# Patient Record
Sex: Female | Born: 1990 | Race: Black or African American | Hispanic: No | Marital: Single | Smoking: Never smoker
Health system: Southern US, Community
[De-identification: ages and names within clinical notes are randomized; demographics above are authoritative.]

---

## 2012-05-22 ENCOUNTER — Emergency Department (HOSPITAL_COMMUNITY)
Admission: EM | Admit: 2012-05-22 | Discharge: 2012-05-22 | Disposition: A | Discharge status: Home / Self Care | Payer: No Typology Code available for payment source | Attending: Emergency Medicine | Admitting: Emergency Medicine

## 2012-05-22 ENCOUNTER — Emergency Department (HOSPITAL_COMMUNITY): Payer: No Typology Code available for payment source

## 2012-05-22 ENCOUNTER — Encounter (HOSPITAL_COMMUNITY): Payer: Self-pay | Admitting: Emergency Medicine

## 2012-05-22 DIAGNOSIS — Y92410 Unspecified street and highway as the place of occurrence of the external cause: Secondary | ICD-10-CM | POA: Insufficient documentation

## 2012-05-22 DIAGNOSIS — Y9389 Activity, other specified: Secondary | ICD-10-CM | POA: Insufficient documentation

## 2012-05-22 DIAGNOSIS — T148XXA Other injury of unspecified body region, initial encounter: Secondary | ICD-10-CM

## 2012-05-22 DIAGNOSIS — IMO0002 Reserved for concepts with insufficient information to code with codable children: Secondary | ICD-10-CM | POA: Insufficient documentation

## 2012-05-22 DIAGNOSIS — S93409A Sprain of unspecified ligament of unspecified ankle, initial encounter: Secondary | ICD-10-CM

## 2012-05-22 DIAGNOSIS — S0993XA Unspecified injury of face, initial encounter: Secondary | ICD-10-CM | POA: Insufficient documentation

## 2012-05-22 DIAGNOSIS — S139XXA Sprain of joints and ligaments of unspecified parts of neck, initial encounter: Secondary | ICD-10-CM | POA: Insufficient documentation

## 2012-05-22 MED ORDER — ACETAMINOPHEN 325 MG PO TABS
650.0000 mg | ORAL_TABLET | Freq: Once | ORAL | Status: AC
  Administered 2012-05-22: 650 mg via ORAL
  Filled 2012-05-22: qty 2

## 2012-05-22 MED ORDER — CYCLOBENZAPRINE HCL 10 MG PO TABS: 10.0000 mg | ORAL_TABLET | Freq: Two times a day (BID) | ORAL | Status: DC | PRN
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
## 2012-05-22 NOTE — ED Notes (Signed)
Ortho paged. 

## 2012-05-22 NOTE — ED Notes (Signed)
Pt ambulatory leaving ED with mother. Pt given d/c teaching and education on prescription. Pt verbalized understanding of d/c teaching and has no further questions upon d/c. Pt does not appear to be in acute distress upon d/c.

## 2012-05-22 NOTE — Progress Notes (Signed)
Orthopedic Tech Progress Note Patient Details:  Allison Cooley [birth year] [id number]  Ortho Devices Type of Ortho Device: Ankle Air splint Ortho Device/Splint Location: left ankle Ortho Device/Splint Interventions: Application   Bonnell Placzek 05/22/2012, 2:37 PM
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
## 2012-05-22 NOTE — ED Notes (Signed)
Pt mother at bedside. Pt mentating appropriately. Pr reports decrease in pain. Pt denies nausea. Pt denies numbness and tingling. Pt able to move all extremities.
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
## 2015-06-12 ENCOUNTER — Ambulatory Visit (INDEPENDENT_AMBULATORY_CARE_PROVIDER_SITE_OTHER): Payer: 59 | Admitting: Podiatry

## 2015-06-12 ENCOUNTER — Encounter: Payer: Self-pay | Admitting: Podiatry

## 2015-06-12 ENCOUNTER — Ambulatory Visit (INDEPENDENT_AMBULATORY_CARE_PROVIDER_SITE_OTHER): Payer: 59

## 2015-06-12 VITALS — BP 126/92 | HR 100 | Resp 12

## 2015-06-12 DIAGNOSIS — M2010 Hallux valgus (acquired), unspecified foot: Secondary | ICD-10-CM

## 2015-06-12 DIAGNOSIS — M62838 Other muscle spasm: Secondary | ICD-10-CM | POA: Diagnosis not present

## 2015-06-12 DIAGNOSIS — M79673 Pain in unspecified foot: Secondary | ICD-10-CM | POA: Diagnosis not present

## 2015-06-12 DIAGNOSIS — M722 Plantar fascial fibromatosis: Secondary | ICD-10-CM

## 2015-06-12 DIAGNOSIS — Q6650 Congenital pes planus, unspecified foot: Secondary | ICD-10-CM | POA: Diagnosis not present

## 2015-06-12 MED ORDER — CYCLOBENZAPRINE HCL 10 MG PO TABS: 10.0000 mg | ORAL_TABLET | Freq: Three times a day (TID) | ORAL | Status: AC | PRN

## 2015-06-12 NOTE — Progress Notes (Signed)
   Subjective:    Patient ID: Allison Cooley, female    DOB: 1991-04-05, 24 y.o.   MRN: [id number]  HPI: She presents today with chief complaint of painful leg and foot spasms usually in the afternoon or evening. She states this is been going on for about 2 weeks. She remarks that she had bunions when she was a child and that her feet are flat and she's never done anything about either one of these. 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    Review of Systems  All other systems reviewed and are negative.      Objective:   Physical Exam: She is a 25 year old African-American female in good spirits vital signs stable alert and oriented 3 in no apparent distress. Pulses are strongly palpable neurologic sensorium is intact versus Weinstein monofilament. Deep tendon reflexes are intact bilateral and muscle strength +5 over 5 dorsiflexion plantar flexors and inverters and everters all intrinsic musculature is intact. Orthopedic evaluation to demonstrate all joints distal to the ankle for range of motion without crepitation that she does have severe flexible flatfoot deformity and mild gastroc equinus bilaterally. Moderate to severe hallux abductovalgus deformity with an elongated second metatarsal prominent reactive hyperkeratosis subsecond and fifth of the left foot. No open lesions or wounds. Radiographs confirm severe pes planus with severe hallux abductovalgus deformity with a first intermetatarsal angle greater than normal values of the proximal 15-20. Elongated second metatarsal also noted severe pes planus no coalitions are apparent. No fractures are apparent.      Assessment & Plan:  Assessment: Severe pes planus with muscle spasms bilateral foot severe hallux valgus deformity.  Plan: Recommended orthotics and prescribed Flexeril 10 mg to be taken at bedtime. I will follow-up with her after her orthotics are in. We did discuss the need for reconstructive surgery  flatfoot or bunion or both and she will discuss this with her family and get back to [place].

## 2015-06-26 ENCOUNTER — Telehealth: Payer: Self-pay | Admitting: *Deleted

## 2015-06-26 NOTE — Telephone Encounter (Signed)
Pt states she has an question about the orthotics she is to pick up.

## 2015-06-26 NOTE — Telephone Encounter (Signed)
Patient had question as to if there was a warranty on the orthotics.  I told the patient no there is no warranty but the manufacturer do state that they will make what ever adjustments necessary to be certain that they are exact to each patient.

## 2015-07-03 ENCOUNTER — Ambulatory Visit: Payer: 59 | Admitting: *Deleted

## 2015-07-03 ENCOUNTER — Ambulatory Visit: Payer: 59 | Admitting: Podiatry

## 2015-07-03 DIAGNOSIS — M722 Plantar fascial fibromatosis: Secondary | ICD-10-CM

## 2015-07-03 NOTE — Progress Notes (Signed)
Patient ID: Allison Cooley, female   DOB: 1991-03-21, 25 y.o.   MRN: [id number] Patient presents for orthotic pick up.  Verbal and written break in and wear instructions given.  Patient will follow up in 4 weeks if symptoms worsen or fail to improve.

## 2015-07-03 NOTE — Patient Instructions (Signed)

## 2015-10-02 ENCOUNTER — Ambulatory Visit (INDEPENDENT_AMBULATORY_CARE_PROVIDER_SITE_OTHER): Payer: 59 | Admitting: Podiatry

## 2015-10-02 ENCOUNTER — Encounter: Payer: Self-pay | Admitting: Podiatry

## 2015-10-02 DIAGNOSIS — M2010 Hallux valgus (acquired), unspecified foot: Secondary | ICD-10-CM

## 2015-10-02 DIAGNOSIS — Q6650 Congenital pes planus, unspecified foot: Secondary | ICD-10-CM

## 2015-10-02 DIAGNOSIS — M2042 Other hammer toe(s) (acquired), left foot: Secondary | ICD-10-CM

## 2015-10-02 NOTE — Progress Notes (Signed)
She presents today to discuss her surgical correction of the left foot. The last time she was in we discussed correcting her bunion deformity second metatarsal shortening and possibly correction of her flat foot deformity. At this point she presents today stating that she would like to have the bunion corrected second toe shortened and the second metatarsal shortened. She started to develop pain beneath the first and second metatarsophalangeal joints and along the medial aspect of the first metatarsophalangeal joint and medial cuneiform joint.  Objective: Vital signs are stable alert and oriented 3. Severe hypermobility at the first metatarsal medial cuneiform joint resulting in bunion deformity. She is elongated second metatarsal resulting in pain on palpation subsecond metatarsophalangeal joint. 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 Radiograph is reviewed today we discussed them in great detail.  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  Plan: We discussed the etiology pathology conservative versus surgical therapies. We discussed Lapidus procedure with bunion repair with screws and plates first metatarsal medial cuneiform joint left foot. Second metatarsal osteotomy. I also discussed in great detail hammertoe repair DIPJ arthroplasty second digit left foot. I answered all questions regarding the best of my ability. She understood this was amenable to it and signed all 3 patient consent form. We did discuss a possible postop complications which may include but are not limited to postop pain bleeding swelling infection recurrence and need for further surgery overcorrection or under correction also digit also limb loss of life. She understands that she will be placed in a nonweightbearing cast and will need to utilize either a knee  scooter or crutches. I'll follow up with her in the near future for surgical intervention.

## 2015-10-02 NOTE — Patient Instructions (Signed)

## 2015-10-25 ENCOUNTER — Telehealth: Payer: Self-pay | Admitting: *Deleted

## 2015-10-31 NOTE — Telephone Encounter (Signed)
Per Marylene LandAngela, patient called and canceled appointment for surgery.  She said she no longer has insurance.  I attempted to call her.  Couldn't leave a message mailbox was full.  [facility]

## 2015-11-22 ENCOUNTER — Encounter: Payer: Self-pay | Admitting: Podiatry
